# Patient Record
Sex: Male | Born: 1985 | Race: White | Hispanic: No | Marital: Single | Smoking: Current every day smoker
Health system: Southern US, Community
[De-identification: ages and names within clinical notes are randomized; demographics above are authoritative.]

## PROBLEM LIST (undated history)

## (undated) DIAGNOSIS — J45909 Unspecified asthma, uncomplicated: Secondary | ICD-10-CM

## (undated) DIAGNOSIS — M1990 Unspecified osteoarthritis, unspecified site: Secondary | ICD-10-CM

## (undated) HISTORY — PX: RECONSTRUCTION OF NOSE: SHX2301

---

## 2015-08-09 ENCOUNTER — Emergency Department (INDEPENDENT_AMBULATORY_CARE_PROVIDER_SITE_OTHER)
Admission: EM | Admit: 2015-08-09 | Discharge: 2015-08-09 | Disposition: A | Discharge status: Home / Self Care | Payer: PRIVATE HEALTH INSURANCE | Source: Home / Self Care | Attending: Family Medicine | Admitting: Family Medicine

## 2015-08-09 ENCOUNTER — Emergency Department (INDEPENDENT_AMBULATORY_CARE_PROVIDER_SITE_OTHER): Payer: PRIVATE HEALTH INSURANCE

## 2015-08-09 ENCOUNTER — Encounter: Payer: Self-pay | Admitting: *Deleted

## 2015-08-09 DIAGNOSIS — M25512 Pain in left shoulder: Secondary | ICD-10-CM | POA: Diagnosis not present

## 2015-08-09 HISTORY — DX: Unspecified osteoarthritis, unspecified site: M19.90

## 2015-08-09 HISTORY — DX: Unspecified asthma, uncomplicated: J45.909

## 2015-08-09 MED ORDER — PREDNISONE 20 MG PO TABS: ORAL_TABLET | ORAL | Status: AC

## 2015-08-09 MED ORDER — IBUPROFEN 600 MG PO TABS: 600.0000 mg | ORAL_TABLET | Freq: Four times a day (QID) | ORAL | Status: AC | PRN

## 2015-08-09 MED ORDER — HYDROCODONE-ACETAMINOPHEN 5-325 MG PO TABS: 1.0000 | ORAL_TABLET | Freq: Four times a day (QID) | ORAL | Status: AC | PRN

## 2015-08-09 NOTE — ED Provider Notes (Signed)
CSN: [id number]     Arrival date & time 08/09/15  1148 History   First MD Initiated Contact with Patient 08/09/15 1213     Chief Complaint  Patient presents with  . Shoulder Pain   (Consider location/radiation/quality/duration/timing/severity/associated sxs/prior Treatment) HPI Pt is a 29yo male presenting to [facility] with c/o gradually worsening Left anterior shoulder pain that started about 2 months ago.  Pt states he does a lot of lifting at the gym as well as at work.  He has stopped working out due to the pain in his shoulder and has been taking ibuprofen w/o relief.  Pain is aching and sharp in nature, worse with movement, especially over head movements.  Denies prior surgery or injury to that shoulder. Pt is Right hand dominant.   Past Medical History  Diagnosis Date  . Asthma   . 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 Reconstruction of nose     Family History  Problem Relation Age of Onset  . Alzheimer's disease Mother   . Heart disease Father   . Parkinson's disease Father    Social History  Substance Use Topics  . Smoking status: Current Every Day Smoker -- 0.25 packs/day    Types: Cigarettes  . Smokeless tobacco: Never Used  . Alcohol Use: Yes    Review of Systems  Musculoskeletal: Positive for myalgias and arthralgias.       Left shoulder  Skin: Negative for rash and wound.  Neurological: Positive for weakness (Left arm due to pain). Negative for numbness.    Allergies  Banana and Penicillins  Home Medications   Prior to Admission medications   Medication Sig Start Date End Date Taking? Authorizing Provider  HYDROcodone-acetaminophen (NORCO/VICODIN) 5-325 MG tablet Take 1-2 tablets by mouth every 6 (six) hours as needed for moderate pain or severe pain. 08/09/15   Junius Finner, PA-C  ibuprofen (ADVIL,MOTRIN) 600 MG tablet Take 1 tablet (600 mg total) by mouth every 6 (six) hours as needed. 08/09/15   Junius Finner, PA-C   predniSONE (DELTASONE) 20 MG tablet 3 tabs po day one, then 2 po daily x 4 days 08/09/15   Junius Finner, PA-C   Meds Ordered and Administered this Visit  Medications - No data to display  BP 131/78 mmHg  Pulse 74  Resp 14  Ht  (1.88 m)  Wt 215 lb (97.523 kg)  BMI 27.59 kg/m2  SpO2 97% No data found.   Physical Exam  Constitutional: He is oriented to person, place, and time. He appears well-developed and well-nourished.  HENT:  Head: Normocephalic and atraumatic.  Eyes: EOM are normal.  Neck: Normal range of motion.  Cardiovascular: Normal rate.   Pulses:      Radial pulses are 2+ on the left side.  Pulmonary/Chest: Effort normal.  Musculoskeletal: Normal range of motion. He exhibits tenderness. He exhibits no edema.  Left shoulder: no deformity. Tenderness over AC joint and biceps groove. Abduction limited to 90 degrees due to severe pain in anterior shoulder. Increased pain with full adduction.  Full ROM Left elbow w/o pain. 5/5 grip strength and flexion and extension at elbow. No midline spinal tenderness.  Neurological: He is alert and oriented to person, place, and time.  Left arm: normal sensation.  Skin: Skin is warm and dry.  Left shoulder: no ecchymosis or erythema. Skin in tact  Psychiatric: He has a normal mood and affect. His behavior is normal.  Nursing note and vitals reviewed.   ED Course  Procedures (including critical care time)  Labs Review Labs Reviewed - No data to display  Imaging Review Dg Shoulder Left  08/09/2015  CLINICAL DATA:  Left shoulder pain for several weeks. No known injury. Painful range of motion. EXAM: LEFT SHOULDER - 2+ VIEW COMPARISON:  None. FINDINGS: There is no evidence of fracture or dislocation. There is no evidence of arthropathy or other focal bone abnormality. Soft tissues are unremarkable. IMPRESSION: Normal exam. Electronically Signed   By: Francene BoyersJames  Maxwell M.D.   On: 08/09/2015 12:48      MDM   1. Left shoulder  pain      Pt c/o worsening Left shoulder pain for 2 months. Pt lifts weights to work out but also as his job. Plain films: negative Symptoms likely due to over use injury, possible subacromial bursitis or biceps tendonitis. Cannot r/o partial rotator cuff tear. Rx: prednison x5 days, norco, and ibuprofen. Home care instructions with ROM exercises provided. Encouraged to f/u with Sports Medicine in 1-2 weeks if not improving as additional imaging or treatment may be warranted.  Work note provided for light duty, no heavy lifting for 7 days/ Patient verbalized understanding and agreement with treatment plan.    Junius Finnerrin O'Malley, PA-C 08/09/15 1336

## 2015-08-09 NOTE — Discharge Instructions (Signed)
Norco (hydrocodone-acetaminophen) is a narcotic pain medication, do not combine these medications with others containing tylenol. While taking, do not drink alcohol, drive, or perform any other activities that requires focus while taking these medications.  °

## 2015-08-09 NOTE — ED Notes (Signed)
Pt c/o 2 months of left shoulder pain without known injury. Pain with activity and limited ROM.

## 2016-08-14 IMAGING — CR DG SHOULDER 2+V*L*
3 series · 3 of 3 positions shown · non-contrast
Comparison: None.

CLINICAL DATA: Left shoulder pain for several weeks. No known
injury. Painful range of motion.

EXAM:
LEFT SHOULDER - 2+ VIEW

[shoulder grashey]
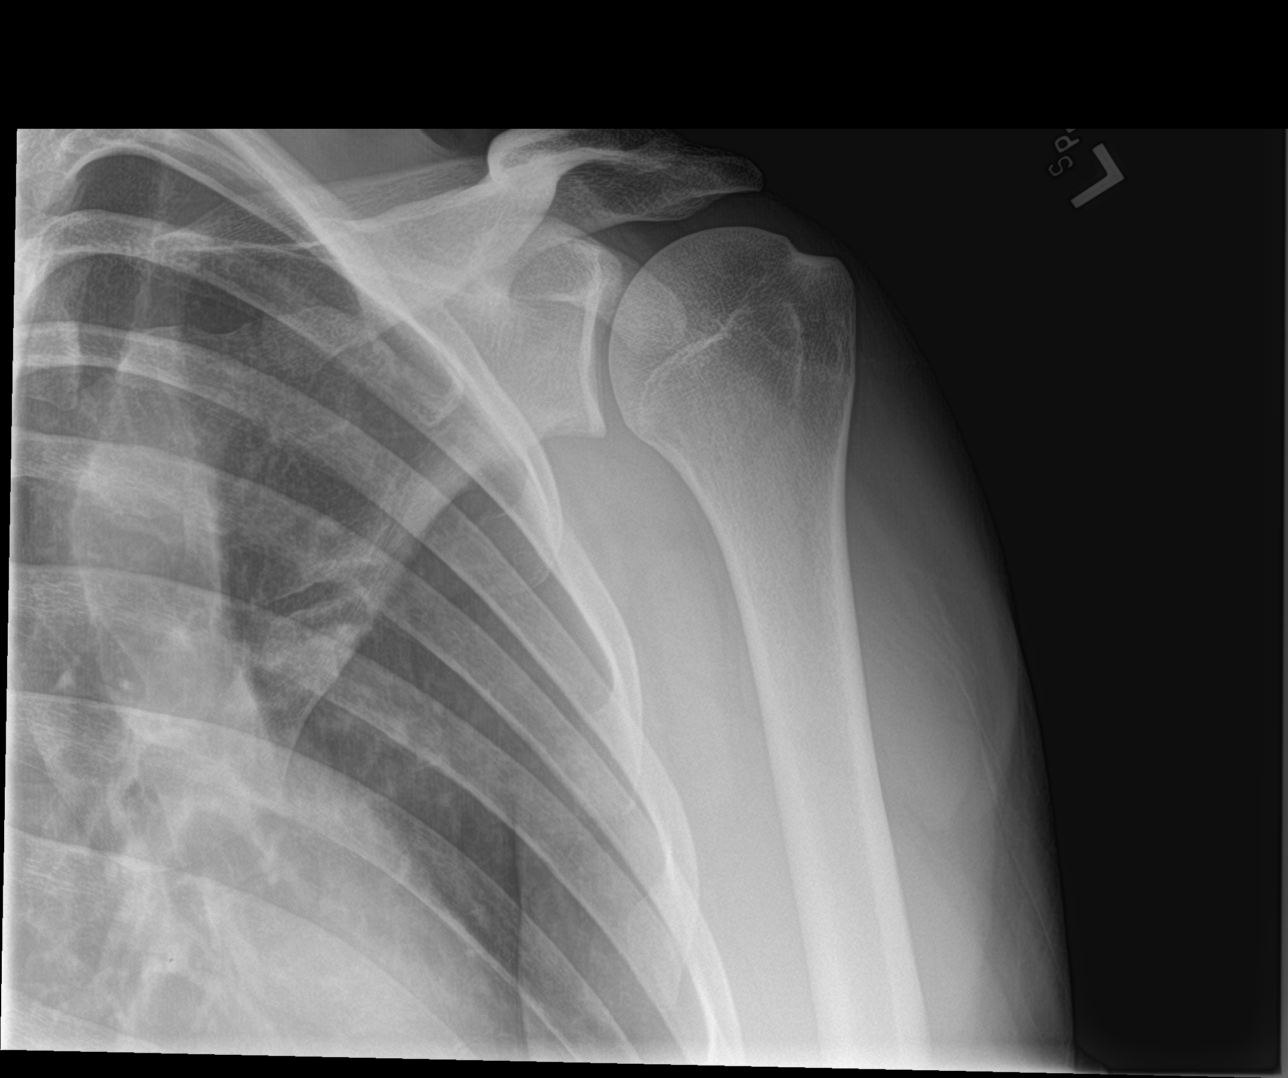

[shoulder y view]
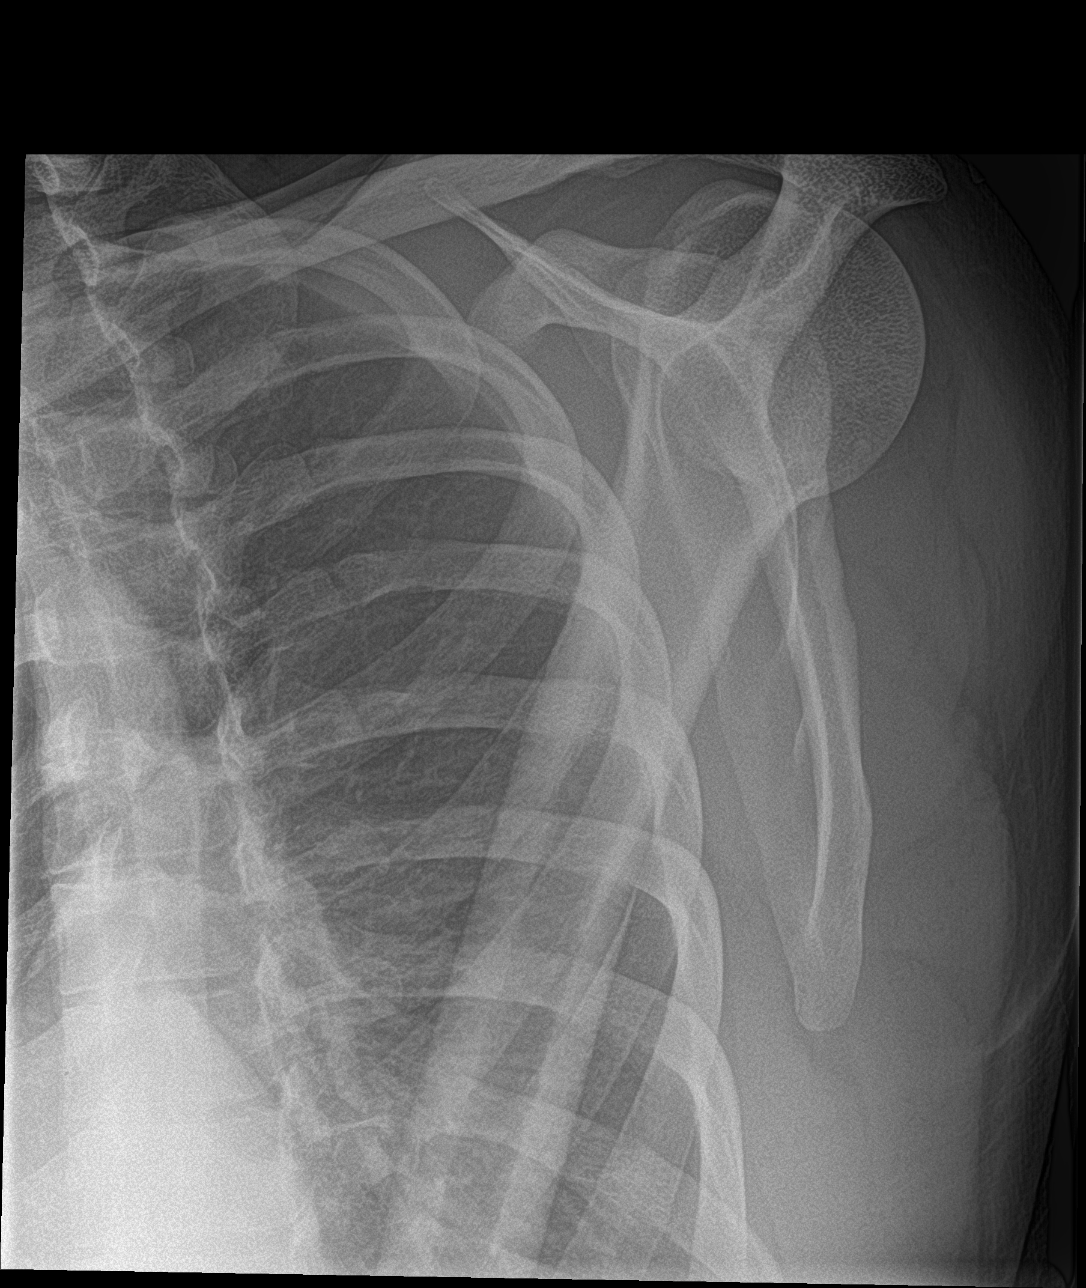

[shoulder axillary]
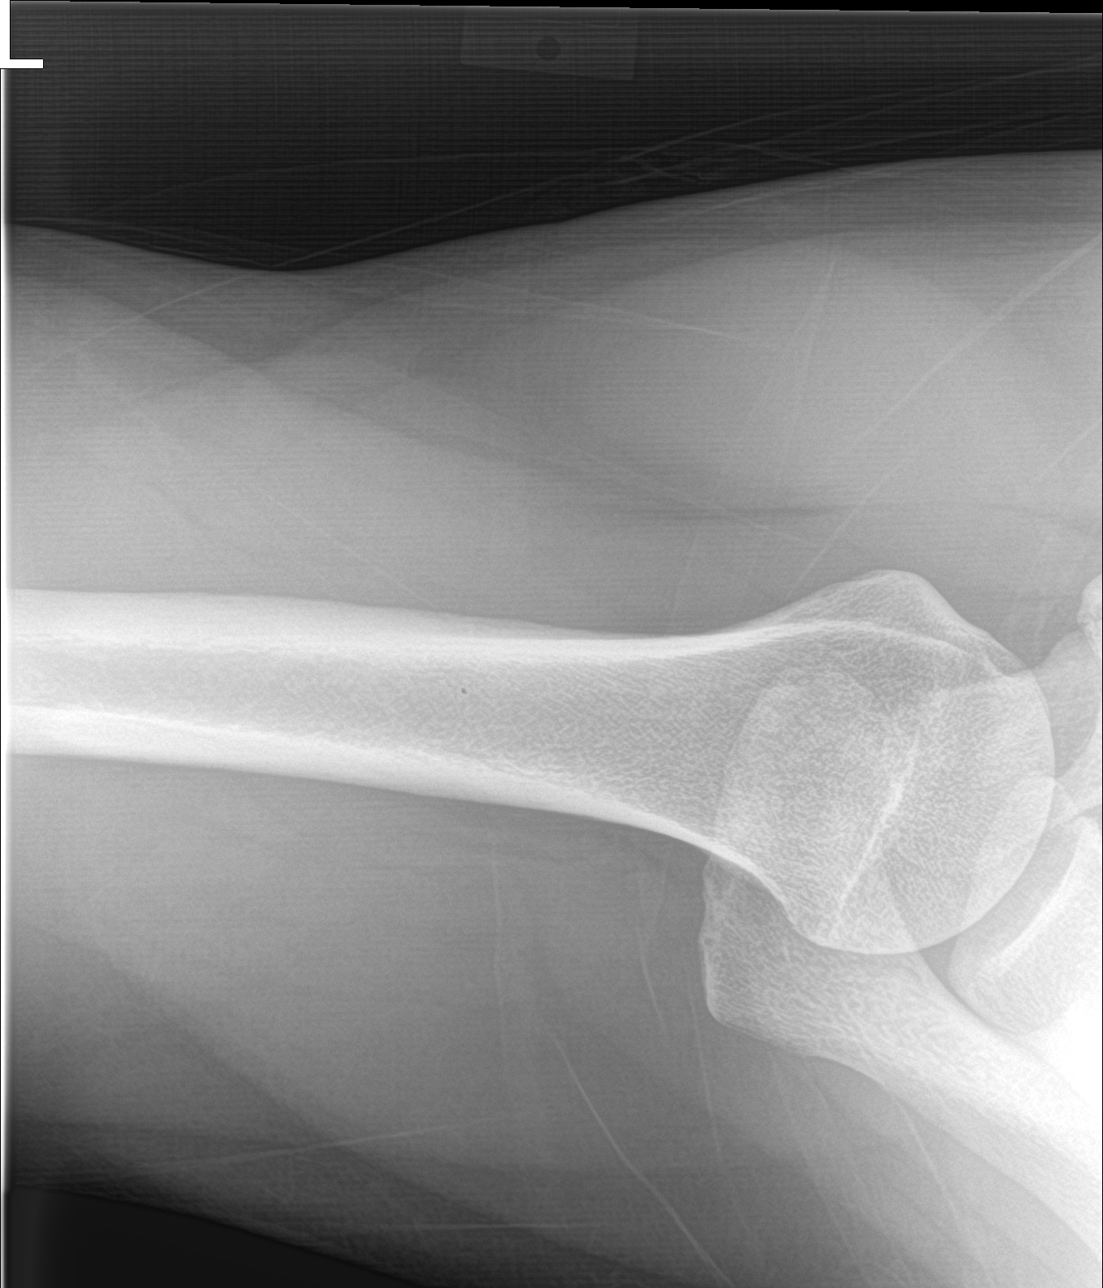

[3 of 3 positions shown; findings below may reference images not displayed]

FINDINGS: There is no evidence of fracture or dislocation. There is no
evidence of arthropathy or other focal bone abnormality. Soft
tissues are unremarkable.
IMPRESSION: Normal exam.
